# Patient Record
Sex: Female | Born: 1965 | Race: White | Hispanic: No | Marital: Married | Smoking: Never smoker
Health system: Southern US, Community
[De-identification: ages and names within clinical notes are randomized; demographics above are authoritative.]

---

## 2009-03-25 ENCOUNTER — Encounter: Admission: RE | Admit: 2009-03-25 | Discharge: 2009-03-25 | Payer: Self-pay | Admitting: Obstetrics and Gynecology

## 2010-04-14 ENCOUNTER — Encounter: Admission: RE | Admit: 2010-04-14 | Discharge: 2010-04-14 | Payer: Self-pay | Admitting: Obstetrics and Gynecology

## 2011-04-14 ENCOUNTER — Other Ambulatory Visit: Payer: Self-pay | Admitting: Obstetrics and Gynecology

## 2011-04-14 DIAGNOSIS — Z1231 Encounter for screening mammogram for malignant neoplasm of breast: Secondary | ICD-10-CM

## 2011-05-04 ENCOUNTER — Ambulatory Visit
Admission: RE | Admit: 2011-05-04 | Discharge: 2011-05-04 | Disposition: A | Discharge status: Home / Self Care | Payer: 59 | Source: Ambulatory Visit | Attending: Obstetrics and Gynecology | Admitting: Obstetrics and Gynecology

## 2011-05-04 DIAGNOSIS — Z1231 Encounter for screening mammogram for malignant neoplasm of breast: Secondary | ICD-10-CM

## 2012-03-29 ENCOUNTER — Other Ambulatory Visit: Payer: Self-pay | Admitting: Obstetrics and Gynecology

## 2012-03-29 DIAGNOSIS — Z1231 Encounter for screening mammogram for malignant neoplasm of breast: Secondary | ICD-10-CM

## 2012-05-08 ENCOUNTER — Ambulatory Visit
Admission: RE | Admit: 2012-05-08 | Discharge: 2012-05-08 | Disposition: A | Discharge status: Home / Self Care | Payer: 59 | Source: Ambulatory Visit | Attending: Obstetrics and Gynecology | Admitting: Obstetrics and Gynecology

## 2012-05-08 DIAGNOSIS — Z1231 Encounter for screening mammogram for malignant neoplasm of breast: Secondary | ICD-10-CM

## 2013-06-14 ENCOUNTER — Other Ambulatory Visit: Payer: Self-pay

## 2013-06-14 DIAGNOSIS — Z1231 Encounter for screening mammogram for malignant neoplasm of breast: Secondary | ICD-10-CM

## 2013-07-05 HISTORY — PX: OTHER SURGICAL HISTORY: SHX169

## 2013-07-20 ENCOUNTER — Ambulatory Visit
Admission: RE | Admit: 2013-07-20 | Discharge: 2013-07-20 | Disposition: A | Discharge status: Home / Self Care | Payer: 59 | Source: Ambulatory Visit

## 2013-07-20 DIAGNOSIS — Z1231 Encounter for screening mammogram for malignant neoplasm of breast: Secondary | ICD-10-CM

## 2016-08-04 DIAGNOSIS — Z0000 Encounter for general adult medical examination without abnormal findings: Secondary | ICD-10-CM | POA: Diagnosis not present

## 2016-08-17 DIAGNOSIS — Z0000 Encounter for general adult medical examination without abnormal findings: Secondary | ICD-10-CM | POA: Diagnosis not present

## 2016-08-17 DIAGNOSIS — Z23 Encounter for immunization: Secondary | ICD-10-CM | POA: Diagnosis not present

## 2016-10-15 DIAGNOSIS — Z23 Encounter for immunization: Secondary | ICD-10-CM | POA: Diagnosis not present

## 2016-11-16 ENCOUNTER — Ambulatory Visit
Admission: RE | Admit: 2016-11-16 | Discharge: 2016-11-16 | Disposition: A | Discharge status: Home / Self Care | Payer: Commercial Managed Care - HMO | Source: Ambulatory Visit | Attending: Family Medicine | Admitting: Family Medicine

## 2016-11-16 ENCOUNTER — Other Ambulatory Visit: Payer: Self-pay | Admitting: Family Medicine

## 2016-11-16 DIAGNOSIS — R0781 Pleurodynia: Secondary | ICD-10-CM

## 2016-11-16 DIAGNOSIS — M940 Chondrocostal junction syndrome [Tietze]: Secondary | ICD-10-CM | POA: Diagnosis not present

## 2016-11-16 DIAGNOSIS — R0789 Other chest pain: Secondary | ICD-10-CM | POA: Diagnosis not present

## 2017-02-28 DIAGNOSIS — Z23 Encounter for immunization: Secondary | ICD-10-CM | POA: Diagnosis not present

## 2017-08-17 DIAGNOSIS — Z0000 Encounter for general adult medical examination without abnormal findings: Secondary | ICD-10-CM | POA: Diagnosis not present

## 2017-08-18 ENCOUNTER — Other Ambulatory Visit: Payer: Self-pay | Admitting: Family Medicine

## 2017-08-18 DIAGNOSIS — Z1231 Encounter for screening mammogram for malignant neoplasm of breast: Secondary | ICD-10-CM

## 2017-08-19 DIAGNOSIS — Z118 Encounter for screening for other infectious and parasitic diseases: Secondary | ICD-10-CM | POA: Diagnosis not present

## 2017-08-19 DIAGNOSIS — Z01419 Encounter for gynecological examination (general) (routine) without abnormal findings: Secondary | ICD-10-CM | POA: Diagnosis not present

## 2017-08-19 DIAGNOSIS — Z0000 Encounter for general adult medical examination without abnormal findings: Secondary | ICD-10-CM | POA: Diagnosis not present

## 2017-09-07 ENCOUNTER — Ambulatory Visit
Admission: RE | Admit: 2017-09-07 | Discharge: 2017-09-07 | Disposition: A | Discharge status: Home / Self Care | Payer: 59 | Source: Ambulatory Visit | Attending: Family Medicine | Admitting: Family Medicine

## 2017-09-07 DIAGNOSIS — Z1231 Encounter for screening mammogram for malignant neoplasm of breast: Secondary | ICD-10-CM

## 2018-05-18 DIAGNOSIS — Z1283 Encounter for screening for malignant neoplasm of skin: Secondary | ICD-10-CM | POA: Diagnosis not present

## 2018-05-18 DIAGNOSIS — B078 Other viral warts: Secondary | ICD-10-CM | POA: Diagnosis not present

## 2018-05-18 DIAGNOSIS — D225 Melanocytic nevi of trunk: Secondary | ICD-10-CM | POA: Diagnosis not present

## 2018-05-18 DIAGNOSIS — L305 Pityriasis alba: Secondary | ICD-10-CM | POA: Diagnosis not present

## 2018-05-18 DIAGNOSIS — L820 Inflamed seborrheic keratosis: Secondary | ICD-10-CM | POA: Diagnosis not present

## 2018-08-02 ENCOUNTER — Other Ambulatory Visit: Payer: Self-pay | Admitting: Family Medicine

## 2018-08-02 DIAGNOSIS — Z1231 Encounter for screening mammogram for malignant neoplasm of breast: Secondary | ICD-10-CM

## 2018-08-15 DIAGNOSIS — M9903 Segmental and somatic dysfunction of lumbar region: Secondary | ICD-10-CM | POA: Diagnosis not present

## 2018-08-15 DIAGNOSIS — M9905 Segmental and somatic dysfunction of pelvic region: Secondary | ICD-10-CM | POA: Diagnosis not present

## 2018-08-15 DIAGNOSIS — M47812 Spondylosis without myelopathy or radiculopathy, cervical region: Secondary | ICD-10-CM | POA: Diagnosis not present

## 2018-08-17 DIAGNOSIS — M9903 Segmental and somatic dysfunction of lumbar region: Secondary | ICD-10-CM | POA: Diagnosis not present

## 2018-08-17 DIAGNOSIS — M9905 Segmental and somatic dysfunction of pelvic region: Secondary | ICD-10-CM | POA: Diagnosis not present

## 2018-08-17 DIAGNOSIS — M47812 Spondylosis without myelopathy or radiculopathy, cervical region: Secondary | ICD-10-CM | POA: Diagnosis not present

## 2018-08-22 DIAGNOSIS — M47812 Spondylosis without myelopathy or radiculopathy, cervical region: Secondary | ICD-10-CM | POA: Diagnosis not present

## 2018-08-22 DIAGNOSIS — M9905 Segmental and somatic dysfunction of pelvic region: Secondary | ICD-10-CM | POA: Diagnosis not present

## 2018-08-22 DIAGNOSIS — M9903 Segmental and somatic dysfunction of lumbar region: Secondary | ICD-10-CM | POA: Diagnosis not present

## 2018-08-23 DIAGNOSIS — Z0000 Encounter for general adult medical examination without abnormal findings: Secondary | ICD-10-CM | POA: Diagnosis not present

## 2018-08-23 DIAGNOSIS — E559 Vitamin D deficiency, unspecified: Secondary | ICD-10-CM | POA: Diagnosis not present

## 2018-08-24 DIAGNOSIS — M9903 Segmental and somatic dysfunction of lumbar region: Secondary | ICD-10-CM | POA: Diagnosis not present

## 2018-08-24 DIAGNOSIS — M9905 Segmental and somatic dysfunction of pelvic region: Secondary | ICD-10-CM | POA: Diagnosis not present

## 2018-08-24 DIAGNOSIS — M47812 Spondylosis without myelopathy or radiculopathy, cervical region: Secondary | ICD-10-CM | POA: Diagnosis not present

## 2018-08-25 DIAGNOSIS — Z681 Body mass index (BMI) 19 or less, adult: Secondary | ICD-10-CM | POA: Diagnosis not present

## 2018-08-25 DIAGNOSIS — Z23 Encounter for immunization: Secondary | ICD-10-CM | POA: Diagnosis not present

## 2018-08-25 DIAGNOSIS — Z0000 Encounter for general adult medical examination without abnormal findings: Secondary | ICD-10-CM | POA: Diagnosis not present

## 2018-08-29 DIAGNOSIS — M9903 Segmental and somatic dysfunction of lumbar region: Secondary | ICD-10-CM | POA: Diagnosis not present

## 2018-08-29 DIAGNOSIS — M47812 Spondylosis without myelopathy or radiculopathy, cervical region: Secondary | ICD-10-CM | POA: Diagnosis not present

## 2018-08-29 DIAGNOSIS — M9905 Segmental and somatic dysfunction of pelvic region: Secondary | ICD-10-CM | POA: Diagnosis not present

## 2018-09-01 ENCOUNTER — Encounter: Payer: Self-pay | Admitting: Internal Medicine

## 2018-09-05 DIAGNOSIS — M9903 Segmental and somatic dysfunction of lumbar region: Secondary | ICD-10-CM | POA: Diagnosis not present

## 2018-09-05 DIAGNOSIS — M9905 Segmental and somatic dysfunction of pelvic region: Secondary | ICD-10-CM | POA: Diagnosis not present

## 2018-09-05 DIAGNOSIS — M47812 Spondylosis without myelopathy or radiculopathy, cervical region: Secondary | ICD-10-CM | POA: Diagnosis not present

## 2018-09-12 ENCOUNTER — Ambulatory Visit
Admission: RE | Admit: 2018-09-12 | Discharge: 2018-09-12 | Disposition: A | Discharge status: Home / Self Care | Payer: 59 | Source: Ambulatory Visit | Attending: Family Medicine | Admitting: Family Medicine

## 2018-09-12 DIAGNOSIS — Z1231 Encounter for screening mammogram for malignant neoplasm of breast: Secondary | ICD-10-CM | POA: Diagnosis not present

## 2018-09-18 ENCOUNTER — Telehealth: Payer: Self-pay | Admitting: *Deleted

## 2018-09-18 NOTE — Telephone Encounter (Signed)
Covid-19 travel screening questions  Have you traveled in the last 14 days? No If yes where?   Do you now or have you had a fever in the last 14 days? No  Do you have any respiratory symptoms of shortness of breath or cough now or in the last 14 days? No  Do you have a medical history of Congestive Heart Failure? No  Do you have a medical history of lung disease? No  Do you have any family members or close contacts with diagnosed or suspected Covid-19? No        

## 2018-09-18 NOTE — Telephone Encounter (Signed)
Covid-19 travel screening questions  Have you traveled in the last 14 days? If yes where?  Do you now or have you had a fever in the last 14 days?  Do you have any respiratory symptoms of shortness of breath or cough now or in the last 14 days?  Do you have a medical history of Congestive Heart Failure?  Do you have a medical history of lung disease?  Do you have any family members or close contacts with diagnosed or suspected Covid-19?       

## 2018-09-19 ENCOUNTER — Other Ambulatory Visit: Payer: Self-pay

## 2018-09-19 ENCOUNTER — Ambulatory Visit (AMBULATORY_SURGERY_CENTER): Payer: Self-pay

## 2018-09-19 ENCOUNTER — Encounter: Payer: Self-pay | Admitting: Internal Medicine

## 2018-09-19 VITALS — Ht 65.0 in | Wt 113.5 lb

## 2018-09-19 DIAGNOSIS — M9905 Segmental and somatic dysfunction of pelvic region: Secondary | ICD-10-CM | POA: Diagnosis not present

## 2018-09-19 DIAGNOSIS — M47812 Spondylosis without myelopathy or radiculopathy, cervical region: Secondary | ICD-10-CM | POA: Diagnosis not present

## 2018-09-19 DIAGNOSIS — Z1211 Encounter for screening for malignant neoplasm of colon: Secondary | ICD-10-CM

## 2018-09-19 DIAGNOSIS — M9903 Segmental and somatic dysfunction of lumbar region: Secondary | ICD-10-CM | POA: Diagnosis not present

## 2018-09-19 MED ORDER — NA SULFATE-K SULFATE-MG SULF 17.5-3.13-1.6 GM/177ML PO SOLN: 1.0000 | Freq: Once | ORAL | 0 refills | Status: AC

## 2018-09-19 NOTE — Progress Notes (Signed)
Per pt, no allergies to soy or egg products.Pt not taking any weight loss meds or using  O2 at home.  Pt refused emmi video.  Temp-98.5. Pt denies any recent travel or any symptoms of any illness at this time.

## 2018-09-28 ENCOUNTER — Telehealth: Payer: Self-pay | Admitting: *Deleted

## 2018-09-28 NOTE — Telephone Encounter (Signed)
LM for patient to inform her that her procedure for 3/31 is cancelled.  Advised her we will call back to reschedule.

## 2018-10-03 ENCOUNTER — Encounter: Payer: 59 | Admitting: Internal Medicine

## 2018-10-05 DIAGNOSIS — M9905 Segmental and somatic dysfunction of pelvic region: Secondary | ICD-10-CM | POA: Diagnosis not present

## 2018-10-05 DIAGNOSIS — M9903 Segmental and somatic dysfunction of lumbar region: Secondary | ICD-10-CM | POA: Diagnosis not present

## 2018-10-05 DIAGNOSIS — M47812 Spondylosis without myelopathy or radiculopathy, cervical region: Secondary | ICD-10-CM | POA: Diagnosis not present

## 2018-10-19 DIAGNOSIS — M47812 Spondylosis without myelopathy or radiculopathy, cervical region: Secondary | ICD-10-CM | POA: Diagnosis not present

## 2018-10-19 DIAGNOSIS — M9903 Segmental and somatic dysfunction of lumbar region: Secondary | ICD-10-CM | POA: Diagnosis not present

## 2018-10-19 DIAGNOSIS — M9905 Segmental and somatic dysfunction of pelvic region: Secondary | ICD-10-CM | POA: Diagnosis not present

## 2018-11-06 ENCOUNTER — Telehealth: Payer: Self-pay | Admitting: *Deleted

## 2018-11-06 NOTE — Telephone Encounter (Signed)
Patient called, no answer. Left message for her to call us back to schedule colonoscopy at this time.

## 2018-11-07 DIAGNOSIS — M9905 Segmental and somatic dysfunction of pelvic region: Secondary | ICD-10-CM | POA: Diagnosis not present

## 2018-11-07 DIAGNOSIS — M47812 Spondylosis without myelopathy or radiculopathy, cervical region: Secondary | ICD-10-CM | POA: Diagnosis not present

## 2018-11-07 DIAGNOSIS — M9903 Segmental and somatic dysfunction of lumbar region: Secondary | ICD-10-CM | POA: Diagnosis not present

## 2018-11-07 NOTE — Telephone Encounter (Signed)
Pt called and  resched colon 11/22/2018 @ 1:30pm

## 2018-11-07 NOTE — Telephone Encounter (Signed)
Mailed new prep instructions for pt's RS 5-20 colon   Lelan Pons PV

## 2018-11-20 ENCOUNTER — Telehealth: Payer: Self-pay

## 2018-11-20 ENCOUNTER — Telehealth: Payer: Self-pay | Admitting: *Deleted

## 2018-11-20 NOTE — Telephone Encounter (Signed)
Unable to reach pt

## 2018-11-20 NOTE — Telephone Encounter (Signed)
Covid-19 travel screening questions  Have you traveled in the last 14 days?no If yes where?  Do you now or have you had a fever in the last 14 days?no  Do you have any respiratory symptoms of shortness of breath or cough now or in the last 14 days?no  Do you have a medical history of Congestive Heart Failure?  Do you have a medical history of lung disease?  Do you have any family members or close contacts with diagnosed or suspected Covid-19?no  Pt aware of care partner policy and will bring a mask with her. SM

## 2018-11-21 DIAGNOSIS — M9903 Segmental and somatic dysfunction of lumbar region: Secondary | ICD-10-CM | POA: Diagnosis not present

## 2018-11-21 DIAGNOSIS — M9905 Segmental and somatic dysfunction of pelvic region: Secondary | ICD-10-CM | POA: Diagnosis not present

## 2018-11-21 DIAGNOSIS — M47812 Spondylosis without myelopathy or radiculopathy, cervical region: Secondary | ICD-10-CM | POA: Diagnosis not present

## 2018-11-22 ENCOUNTER — Other Ambulatory Visit: Payer: Self-pay

## 2018-11-22 ENCOUNTER — Encounter: Payer: Self-pay | Admitting: Internal Medicine

## 2018-11-22 ENCOUNTER — Ambulatory Visit (AMBULATORY_SURGERY_CENTER): Payer: 59 | Admitting: Internal Medicine

## 2018-11-22 VITALS — BP 96/69 | HR 67 | Temp 98.3°F | Resp 11 | Ht 65.0 in | Wt 113.0 lb

## 2018-11-22 DIAGNOSIS — D120 Benign neoplasm of cecum: Secondary | ICD-10-CM

## 2018-11-22 DIAGNOSIS — D122 Benign neoplasm of ascending colon: Secondary | ICD-10-CM

## 2018-11-22 DIAGNOSIS — D123 Benign neoplasm of transverse colon: Secondary | ICD-10-CM | POA: Diagnosis not present

## 2018-11-22 DIAGNOSIS — Z1211 Encounter for screening for malignant neoplasm of colon: Secondary | ICD-10-CM

## 2018-11-22 MED ORDER — SODIUM CHLORIDE 0.9 % IV SOLN: 500.0000 mL | Freq: Once | INTRAVENOUS | Status: DC

## 2018-11-22 NOTE — Progress Notes (Signed)
Pt's states no medical or surgical changes since previsit or office visit. Natasha Nguyen-Temps,Judy Branson-vital signs. 

## 2018-11-22 NOTE — Op Note (Signed)
[place] Patient Name: Natasha Nguyen Procedure Date: 11/22/2018 2:03 PM MRN: [id number] Endoscopist: Jerene Bears , MD Age: 53 Referring MD:  Date of Birth: 05-May-1966 Gender: Female Account #: [id number] Procedure:                Colonoscopy Indications:              Screening for colorectal malignant neoplasm, This                            is the patient's first colonoscopy Medicines:                Monitored Anesthesia Care Procedure:                Pre-Anesthesia Assessment:                           - Prior to the procedure, a History and Physical                            was performed, and patient medications and                            allergies were reviewed. The patient's tolerance of                            previous anesthesia was also reviewed. The risks                            and benefits of the procedure and the sedation                            options and risks were discussed with the patient.                            All questions were answered, and informed consent                            was obtained. Prior Anticoagulants: The patient has                            taken no previous anticoagulant or antiplatelet                            agents. ASA Grade Assessment: I - A normal, healthy                            patient. After reviewing the risks and benefits,                            the patient was deemed in satisfactory condition to                            undergo the procedure.  After obtaining informed consent, the colonoscope                            was passed under direct vision. Throughout the                            procedure, the patient's blood pressure, pulse, and                            oxygen saturations were monitored continuously. The                            Colonoscope was introduced through the anus and                            advanced to the cecum, identified by  appendiceal                            orifice and ileocecal valve. The colonoscopy was                            performed without difficulty. The patient tolerated                            the procedure well. The quality of the bowel                            preparation was good. The ileocecal valve,                            appendiceal orifice, and rectum were photographed. Scope In: 2:10:43 PM Scope Out: 2:31:04 PM Scope Withdrawal Time: 0 hours 15 minutes 32 seconds  Total Procedure Duration: 0 hours 20 minutes 21 seconds  Findings:                 The digital rectal exam was normal.                           A 1 mm polyp was found in the cecum. The polyp was                            sessile. The polyp was removed with a cold snare.                            Resection and retrieval were complete.                           A 5 mm polyp was found in the ascending colon. The                            polyp was sessile. The polyp was removed with a  cold snare. Resection and retrieval were complete.                           Two sessile polyps were found in the transverse                            colon. The polyps were 2 to 4 mm in size. These                            polyps were removed with a cold snare. Resection                            and retrieval were complete.                           The exam was otherwise without abnormality on                            direct and retroflexion views. Complications:            No immediate complications. Estimated Blood Loss:     Estimated blood loss was minimal. Impression:               - One 1 mm polyp in the cecum, removed with a cold                            snare. Resected and retrieved.                           - One 5 mm polyp in the ascending colon, removed                            with a cold snare. Resected and retrieved.                           - Two 2 to 4 mm polyps in the  transverse colon,                            removed with a cold snare. Resected and retrieved.                           - The examination was otherwise normal on direct                            and retroflexion views. Recommendation:           - Patient has a contact number available for                            emergencies. The signs and symptoms of potential                            delayed complications were discussed with the  patient. Return to normal activities tomorrow.                            Written discharge instructions were provided to the                            patient.                           - Resume previous diet.                           - Continue present medications.                           - Await pathology results.                           - Repeat colonoscopy is recommended for                            surveillance. The colonoscopy date will be                            determined after pathology results from today's                            exam become available for review. Jerene Bears, MD 11/22/2018 2:33:57 PM This report has been signed electronically.

## 2018-11-22 NOTE — Progress Notes (Signed)
Called to room to assist during endoscopic procedure.  Patient ID and intended procedure confirmed with present staff. Received instructions for my participation in the procedure from the performing physician.  

## 2018-11-22 NOTE — Patient Instructions (Signed)
YOU HAD AN ENDOSCOPIC PROCEDURE TODAY AT [facility]:   Refer to the procedure report that was given to you for any specific questions about what was found during the examination.  If the procedure report does not answer your questions, please call your gastroenterologist to clarify.  If you requested that your care partner not be given the details of your procedure findings, then the procedure report has been included in a sealed envelope for you to review at your convenience later.  YOU SHOULD EXPECT: Some feelings of bloating in the abdomen. Passage of more gas than usual.  Walking can help get rid of the air that was put into your GI tract during the procedure and reduce the bloating. If you had a lower endoscopy (such as a colonoscopy or flexible sigmoidoscopy) you may notice spotting of blood in your stool or on the toilet paper. If you underwent a bowel prep for your procedure, you may not have a normal bowel movement for a few days.  Please Note:  You might notice some irritation and congestion in your nose or some drainage.  This is from the oxygen used during your procedure.  There is no need for concern and it should clear up in a day or so.  SYMPTOMS TO REPORT IMMEDIATELY:   Following lower endoscopy (colonoscopy or flexible sigmoidoscopy):  Excessive amounts of blood in the stool  Significant tenderness or worsening of abdominal pains  Swelling of the abdomen that is new, acute  Fever of 100F or higher  For urgent or emergent issues, a gastroenterologist can be reached at any hour by calling [phone].   DIET:  We do recommend a small meal at first, but then you may proceed to your regular diet.  Drink plenty of fluids but you should avoid alcoholic beverages for 24 hours.  ACTIVITY:  You should plan to take it easy for the rest of today and you should NOT DRIVE or use heavy machinery until tomorrow (because of the sedation medicines used during the test).     FOLLOW UP: Our staff will call the number listed on your records 48-72 hours following your procedure to check on you and address any questions or concerns that you may have regarding the information given to you following your procedure. If we do not reach you, we will leave a message.  We will attempt to reach you two times.  During this call, we will ask if you have developed any symptoms of COVID 19. If you develop any symptoms (for example fever, flu-like symptoms, shortness of breath, cough etc.) before then, please call [phone].  If any biopsies were taken you will be contacted by phone or by letter within the next 1-3 weeks.  Please call us at [phone] if you have not heard about the biopsies in 3 weeks.   Await for biopsy results Polyps (handout given)  SIGNATURES/CONFIDENTIALITY: You and/or your care partner have signed paperwork which will be entered into your electronic medical record.  These signatures attest to the fact that that the information above on your After Visit Summary has been reviewed and is understood.  Full responsibility of the confidentiality of this discharge information lies with you and/or your care-partner.

## 2018-11-22 NOTE — Progress Notes (Signed)
To PACU, VSS. Report to [email or web address] 

## 2018-11-24 ENCOUNTER — Telehealth: Payer: Self-pay | Admitting: *Deleted

## 2018-11-24 ENCOUNTER — Telehealth: Payer: Self-pay

## 2018-11-24 NOTE — Telephone Encounter (Signed)
Left message on follow up call. 

## 2018-11-24 NOTE — Telephone Encounter (Signed)
  Follow up Call-  Call back number 11/22/2018  Post procedure Call Back phone  # [phone]  Permission to leave phone message Yes  Some recent data might be hidden     Patient questions:  Do you have a fever, pain , or abdominal swelling? No. Pain Score  0 *  Have you tolerated food without any problems? Yes.    Have you been able to return to your normal activities? Yes.    Do you have any questions about your discharge instructions: Diet   No. Medications  No. Follow up visit  No.  Do you have questions or concerns about your Care? No.  Actions: * If pain score is 4 or above: No action needed, pain <4.  1. Have you developed a fever since your procedure? no  2.   Have you had an respiratory symptoms (SOB or cough) since your procedure? no  3.   Have you tested positive for COVID 19 since your procedure no  4.   Have you had any family members/close contacts diagnosed with the COVID 19 since your procedure?  no   If any of these questions are a yes, please inquire if patient has been seen by family doctor and route this note to Joylene John, Therapist, sports.

## 2018-11-28 ENCOUNTER — Encounter: Payer: Self-pay | Admitting: Internal Medicine

## 2019-08-29 ENCOUNTER — Emergency Department (HOSPITAL_COMMUNITY): Payer: 59

## 2019-08-29 ENCOUNTER — Encounter (HOSPITAL_COMMUNITY): Payer: Self-pay | Admitting: Emergency Medicine

## 2019-08-29 ENCOUNTER — Emergency Department (HOSPITAL_COMMUNITY)
Admission: EM | Admit: 2019-08-29 | Discharge: 2019-08-29 | Disposition: A | Discharge status: Home / Self Care | Payer: 59 | Attending: Emergency Medicine | Admitting: Emergency Medicine

## 2019-08-29 ENCOUNTER — Other Ambulatory Visit: Payer: Self-pay

## 2019-08-29 DIAGNOSIS — R102 Pelvic and perineal pain: Secondary | ICD-10-CM | POA: Diagnosis not present

## 2019-08-29 DIAGNOSIS — K529 Noninfective gastroenteritis and colitis, unspecified: Secondary | ICD-10-CM | POA: Insufficient documentation

## 2019-08-29 LAB — COMPREHENSIVE METABOLIC PANEL
ALT: 11 U/L (ref 0–44)
AST: 13 U/L — ABNORMAL LOW (ref 15–41)
Albumin: 3.9 g/dL (ref 3.5–5.0)
Alkaline Phosphatase: 51 U/L (ref 38–126)
Anion gap: 9 (ref 5–15)
BUN: 23 mg/dL — ABNORMAL HIGH (ref 6–20)
CO2: 26 mmol/L (ref 22–32)
Calcium: 9.8 mg/dL (ref 8.9–10.3)
Chloride: 103 mmol/L (ref 98–111)
Creatinine, Ser: 0.76 mg/dL (ref 0.44–1.00)
GFR calc Af Amer: >60 mL/min (ref >60)
GFR calc non Af Amer: >60 mL/min (ref >60)
Glucose, Bld: 107 mg/dL — ABNORMAL HIGH (ref 70–99)
Potassium: 3.5 mmol/L (ref 3.5–5.1)
Sodium: 138 mmol/L (ref 135–145)
Total Bilirubin: 0.5 mg/dL (ref 0.3–1.2)
Total Protein: 6.4 g/dL — ABNORMAL LOW (ref 6.5–8.1)

## 2019-08-29 LAB — URINALYSIS, ROUTINE W REFLEX MICROSCOPIC
Bilirubin Urine: NEGATIVE
Glucose, UA: NEGATIVE mg/dL
Hgb urine dipstick: NEGATIVE
Ketones, ur: NEGATIVE mg/dL
Leukocytes,Ua: NEGATIVE
Nitrite: NEGATIVE
Protein, ur: NEGATIVE mg/dL
Specific Gravity, Urine: 1.002 — ABNORMAL LOW (ref 1.005–1.030)
pH: 7 (ref 5.0–8.0)

## 2019-08-29 LAB — CBC
HCT: 38.9 % (ref 36.0–46.0)
Hemoglobin: 13.1 g/dL (ref 12.0–15.0)
MCH: 31.6 pg (ref 26.0–34.0)
MCHC: 33.7 g/dL (ref 30.0–36.0)
MCV: 93.7 fL (ref 80.0–100.0)
Platelets: 339 10*3/uL (ref 150–400)
RBC: 4.15 MIL/uL (ref 3.87–5.11)
RDW: 12.2 % (ref 11.5–15.5)
WBC: 8.6 10*3/uL (ref 4.0–10.5)
nRBC: 0 % (ref 0.0–0.2)

## 2019-08-29 LAB — LIPASE, BLOOD: Lipase: 34 U/L (ref 11–51)

## 2019-08-29 LAB — I-STAT BETA HCG BLOOD, ED (MC, WL, AP ONLY): I-stat hCG, quantitative: <5 m[IU]/mL (ref <5)

## 2019-08-29 MED ORDER — SODIUM CHLORIDE 0.9% FLUSH
3.0000 mL | Freq: Once | INTRAVENOUS | Status: AC
  Administered 2019-08-29: 05:00:00 3 mL via INTRAVENOUS

## 2019-08-29 MED ORDER — IOHEXOL 300 MG/ML  SOLN
100.0000 mL | Freq: Once | INTRAMUSCULAR | Status: AC | PRN
  Administered 2019-08-29: 100 mL via INTRAVENOUS

## 2019-08-29 MED ORDER — KETOROLAC TROMETHAMINE 30 MG/ML IJ SOLN
15.0000 mg | Freq: Once | INTRAMUSCULAR | Status: AC
  Administered 2019-08-29: 15 mg via INTRAVENOUS
  Filled 2019-08-29: qty 1

## 2019-08-29 MED ORDER — DICYCLOMINE HCL 20 MG PO TABS: 20.0000 mg | ORAL_TABLET | Freq: Two times a day (BID) | ORAL | 0 refills | Status: DC | PRN

## 2019-08-29 MED ORDER — CIPROFLOXACIN HCL 500 MG PO TABS: 500.0000 mg | ORAL_TABLET | Freq: Two times a day (BID) | ORAL | 0 refills | Status: DC

## 2019-08-29 NOTE — ED Notes (Signed)
Pt transported to [facility]

## 2019-08-29 NOTE — ED Provider Notes (Signed)
[facility] EMERGENCY DEPARTMENT Provider Note   CSN: [id number] Arrival date & time: 08/29/19  0157     History Chief Complaint  Patient presents with  . Abdominal Pain    Natasha Nguyen is a 54 y.o. female.  54 year old female presents to the emergency department for evaluation of abdominal pain.  She states that her pain began more abruptly at 2300 tonight and has remained constant.  She describes a "piercing" pain which is more to the right and below her bellybutton.  She tried taking Pepcid and a Pepto-Bismol for symptoms without relief.  Felt that her pain was increasingly aggravated when she tried to extend her back and abdomen.  Had a bowel movement earlier tonight that was consistent with diarrhea.  No associated fevers, nausea, vomiting, dysuria, hematuria, melena or hematochezia.  Abdominal surgical history significant for cesarean section x2.  Has not had any menstrual cycle since uterine ablation.  Denies recent sick contacts.  The history is provided by the patient. No language interpreter was used.  Abdominal Pain      History reviewed. No pertinent past medical history.  There are no problems to display for this patient.   Past Surgical History:  Procedure Laterality Date  . CESAREAN SECTION     2 times  . uterine ablation  2015     OB History   No obstetric history on file.     Family History  Problem Relation Age of Onset  . Breast cancer Mother     Social History   Tobacco Use  . Smoking status: Never Smoker  . Smokeless tobacco: Never Used  Substance Use Topics  . Alcohol use: Yes    Comment: social  . Drug use: Never    Home Medications Prior to Admission medications   Medication Sig Start Date End Date Taking? Authorizing Provider  ciprofloxacin (CIPRO) 500 MG tablet Take 1 tablet (500 mg total) by mouth every 12 (twelve) hours. 08/29/19   Antonietta Breach, PA-C  dicyclomine (BENTYL) 20 MG tablet Take 1 tablet (20 mg  total) by mouth every 12 (twelve) hours as needed (for abdominal pain/cramping). 08/29/19   Antonietta Breach, PA-C    Allergies    Patient has no known allergies.  Review of Systems   Review of Systems  Gastrointestinal: Positive for abdominal pain.  Ten systems reviewed and are negative for acute change, except as noted in the HPI.    Physical Exam Updated Vital Signs BP 115/71   Pulse 77   Temp (!) 97.4 F (36.3 C) (Oral)   Resp 15   Ht 5\' 5"  (1.651 m)   Wt 48.5 kg   SpO2 100%   BMI 17.81 kg/m   Physical Exam Vitals and nursing note reviewed.  Constitutional:      General: She is not in acute distress.    Appearance: She is well-developed. She is not diaphoretic.     Comments: Nontoxic appearing and in NAD  HENT:     Head: Normocephalic and atraumatic.  Eyes:     General: No scleral icterus.    Conjunctiva/sclera: Conjunctivae normal.  Cardiovascular:     Rate and Rhythm: Normal rate and regular rhythm.     Pulses: Normal pulses.  Pulmonary:     Effort: Pulmonary effort is normal. No respiratory distress.     Comments: Respirations even and unlabored Abdominal:     General: There is no distension.     Palpations: Abdomen is soft.  Tenderness: Negative signs include Murphy's sign.    Musculoskeletal:        General: Normal range of motion.     Cervical back: Normal range of motion.  Skin:    General: Skin is warm and dry.     Coloration: Skin is not pale.     Findings: No erythema or rash.  Neurological:     Mental Status: She is alert and oriented to person, place, and time.     Coordination: Coordination normal.  Psychiatric:        Behavior: Behavior normal.     ED Results / Procedures / Treatments   Labs (all labs ordered are listed, but only abnormal results are displayed) Labs Reviewed  COMPREHENSIVE METABOLIC PANEL - Abnormal; Notable for the following components:      Result Value   Glucose, Bld 107 (*)    BUN 23 (*)    Total Protein 6.4  (*)    AST 13 (*)    All other components within normal limits  URINALYSIS, ROUTINE W REFLEX MICROSCOPIC - Abnormal; Notable for the following components:   Color, Urine COLORLESS (*)    Specific Gravity, Urine 1.002 (*)    All other components within normal limits  LIPASE, BLOOD  CBC  I-STAT BETA HCG BLOOD, ED ([facility], [facility], [facility] ONLY)    EKG EKG Interpretation  Date/Time:  Wednesday August 29 2019 02:15:53 EST Ventricular Rate:  69 PR Interval:  136 QRS Duration: 80 QT Interval:  378 QTC Calculation: 405 R Axis:   90 Text Interpretation: Normal sinus rhythm Rightward axis Borderline ECG No old tracing to compare Confirmed by Delora Fuel ([id number]) on 08/29/2019 3:18:17 AM   Radiology CT ABDOMEN PELVIS W CONTRAST  Result Date: 08/29/2019 CLINICAL DATA:  Periumbilical abdominal pain began at 2300 hours EXAM: CT ABDOMEN AND PELVIS WITH CONTRAST TECHNIQUE: Multidetector CT imaging of the abdomen and pelvis was performed using the standard protocol following bolus administration of intravenous contrast. CONTRAST:  160mL OMNIPAQUE IOHEXOL 300 MG/ML  SOLN COMPARISON:  Pelvic ultrasound 08/29/2019 FINDINGS: Lower chest: Lung bases are clear. Much of the heart is collimated from view. Hepatobiliary: Small amount of focal fatty infiltration along the falciform ligament. 7 mm hypoattenuating focus in the posterior right lobe liver is too small to fully characterize on CT imaging but statistically likely benign. Gallbladder is unremarkable without calcified gallstones, gallbladder wall thickening, or biliary dilatation. Pancreas: Unremarkable. No pancreatic ductal dilatation or surrounding inflammatory changes. Spleen: Benign-appearing probable cyst versus lymphangioma in the posterior spleen measuring 10 mm. No worrisome splenic lesions. Normal splenic size. Adrenals/Urinary Tract: Normal adrenal glands. Symmetric renal enhancement and excretion. Subcentimeter hypoattenuating foci in the interpolar left  and lower pole right kidney are too small to characterize. No worrisome renal lesions. No urolithiasis or hydronephrosis. Ureters are unremarkable. Urinary bladder is free of acute abnormality. Stomach/Bowel: Distal esophagus, stomach and duodenal sweep are unremarkable. No small bowel wall thickening or dilatation. No evidence of obstruction. A normal appendix is visualized. Mild edematous mural thickening involving much of the distal colon from the transverse to sigmoid. Vascular/Lymphatic: The aorta is normal caliber. No suspicious or enlarged lymph nodes in the included lymphatic chains. Reproductive: Anteverted uterus. Hypoattenuation at the lower uterine segment compatible with prior Caesarean sections. 1.4 cm left para ovarian cyst is again visualized. Better seen on this examination is a 2.4 cm cyst in the right adnexa as well. No concerning features. Other: No free fluid. No free air. No bowel containing hernias.  Musculoskeletal: No acute osseous abnormality or suspicious osseous lesion. IMPRESSION: 1. Mild edematous mural thickening involving much of the distal colon from the transverse to the sigmoid colon may represent infectious or inflammatory colitis. Electronically Signed   By: Lovena Le M.D.   On: 08/29/2019 06:04   US PELVIC DOPPLER (TORSION R/O OR MASS ARTERIAL FLOW)  Result Date: 08/29/2019 CLINICAL DATA:  Pelvic pain, history of prior Caesarean section x2 EXAM: TRANSABDOMINAL AND TRANSVAGINAL ULTRASOUND OF PELVIS DOPPLER ULTRASOUND OF OVARIES TECHNIQUE: Both transabdominal and transvaginal ultrasound examinations of the pelvis were performed. Transabdominal technique was performed for global imaging of the pelvis including uterus, ovaries, adnexal regions, and pelvic cul-de-sac. It was necessary to proceed with endovaginal exam following the transabdominal exam to visualize the uterus, endometrium, and ovaries. Color and duplex Doppler ultrasound was utilized to evaluate blood flow to the  ovaries. COMPARISON:  None. FINDINGS: Uterus Measurements: 7.7 x 3.4 x 4.5 cm = volume: 62.1 mL. Hypoattenuation along the lower uterine segment may reflect scarring from prior Caesarean section. No fibroids or other mass visualized. Endometrium Thickness: 3 mm.  No focal abnormality visualized. Right ovary Measurements: 1.9 x 1.2 x 1.4 cm = volume: 1.7 mL. Normal appearance/no adnexal mass. Left ovary Measurements: 1.9 x 1.5 x 2.8 cm = volume: 4.2 mL. Anechoic cystic structure in the left adnexa appears separate from the left ovary, likely simple para ovarian cyst measuring 1.2 x 1.2 x 1.4 cm in size. Pulsed Doppler evaluation of both ovaries demonstrates normal low-resistance arterial and venous waveforms. Other findings No abnormal free fluid. IMPRESSION: 1.4 cm cyst in the left adnexa, likely para ovarian cyst. Otherwise unremarkable pelvic ultrasound. Electronically Signed   By: Lovena Le M.D.   On: 08/29/2019 05:04   US PELVIC COMPLETE WITH TRANSVAGINAL  Result Date: 08/29/2019 CLINICAL DATA:  Pelvic pain, history of prior Caesarean section x2 EXAM: TRANSABDOMINAL AND TRANSVAGINAL ULTRASOUND OF PELVIS DOPPLER ULTRASOUND OF OVARIES TECHNIQUE: Both transabdominal and transvaginal ultrasound examinations of the pelvis were performed. Transabdominal technique was performed for global imaging of the pelvis including uterus, ovaries, adnexal regions, and pelvic cul-de-sac. It was necessary to proceed with endovaginal exam following the transabdominal exam to visualize the uterus, endometrium, and ovaries. Color and duplex Doppler ultrasound was utilized to evaluate blood flow to the ovaries. COMPARISON:  None. FINDINGS: Uterus Measurements: 7.7 x 3.4 x 4.5 cm = volume: 62.1 mL. Hypoattenuation along the lower uterine segment may reflect scarring from prior Caesarean section. No fibroids or other mass visualized. Endometrium Thickness: 3 mm.  No focal abnormality visualized. Right ovary Measurements: 1.9 x 1.2  x 1.4 cm = volume: 1.7 mL. Normal appearance/no adnexal mass. Left ovary Measurements: 1.9 x 1.5 x 2.8 cm = volume: 4.2 mL. Anechoic cystic structure in the left adnexa appears separate from the left ovary, likely simple para ovarian cyst measuring 1.2 x 1.2 x 1.4 cm in size. Pulsed Doppler evaluation of both ovaries demonstrates normal low-resistance arterial and venous waveforms. Other findings No abnormal free fluid. IMPRESSION: 1.4 cm cyst in the left adnexa, likely para ovarian cyst. Otherwise unremarkable pelvic ultrasound. Electronically Signed   By: Lovena Le M.D.   On: 08/29/2019 05:04    Procedures Procedures (including critical care time)  Medications Ordered in ED Medications  sodium chloride flush (NS) 0.9 % injection 3 mL (3 mLs Intravenous Given 08/29/19 0439)  ketorolac (TORADOL) 30 MG/ML injection 15 mg (15 mg Intravenous Given 08/29/19 0425)  iohexol (OMNIPAQUE) 300 MG/ML solution 100 mL (100  mLs Intravenous Contrast Given 08/29/19 0540)    ED Course  I have reviewed the triage vital signs and the nursing notes.  Pertinent labs & imaging results that were available during my care of the patient were reviewed by me and considered in my medical decision making (see chart for details).  Clinical Course as of Aug 28 621  Wed Aug 29, 2019  0406 Patient presenting for lower abdominal pain.  Has focal tenderness in the right suprapubic abdomen as well as referred pain to the right suprapubic abdomen on palpation of the left lower quadrant.  Abdomen is nondistended, soft.  No peritoneal signs.  More sudden onset of pain is less characteristic of appendicitis.  Patient also does not have any leukocytosis to suggest infectious etiology.  She does have minimal tenderness to the right of her epigastrium, but there is also low suspicion for gallbladder process given preserved LFTs, lack of vomiting.  No hematuria to suggest kidney stone.  Urinalysis without evidence of UTI.  Plan for  pelvic ultrasound to assess for ovarian cyst versus ovarian torsion.  Patient resting comfortably at this time, agreeable with plan.  She has no history of abdominal surgeries.   [KH]  [id number] Ultrasound shows left ovarian cyst, though this is favored to be an incidental finding as it does not correlate as specifically with the patient's area of tenderness.  There is no evidence of ovarian torsion.  Results communicated to patient who verbalizes understanding.  Patient is persistently tender on repeat abdominal exam.  Discussed outpatient symptom control versus further work-up with CT abdomen/pelvis.  Patient comfortable with plan to proceed with additional imaging.  Declines additional pain medication at this time.   [KH]    Clinical Course User Index [KH] Beverely Pace   MDM Rules/Calculators/A&P                      54 year old female presenting for abdominal pain.  Her evaluation today is consistent with mild colitis seen on CT scan.  She is afebrile with stable vital signs.  No leukocytosis.  Symptoms have been present for approximately 10 hours.  It is possible that her symptoms may be food-borne or viral in etiology.  Discussed conservative management including the use of Tylenol, Motrin, Bentyl as needed.  Encouraged use of a daily probiotic as well.  Should symptoms persist beyond 48 hours, will prescribe course of ciprofloxacin to start.  Patient encouraged to follow up with her primary care doctor.  Return precautions discussed and provided. Patient discharged in stable condition with no unaddressed concerns.   Final Clinical Impression(s) / ED Diagnoses Final diagnoses:  Pelvic pain in female  Colitis    Rx / DC Orders ED Discharge Orders         Ordered    ciprofloxacin (CIPRO) 500 MG tablet  Every 12 hours     08/29/19 0619    dicyclomine (BENTYL) 20 MG tablet  Every 12 hours PRN     08/29/19 0619           Sukari, Doerflinger, PA-C 08/29/19 [id number]    Veryl Speak,  MD 08/29/19 [phone]

## 2019-08-29 NOTE — ED Triage Notes (Signed)
Pt reports "pearcing"  abdominal pain mainly around her "belly button" that started around 11pm.  The pain continued to get worse to the point where she could not sleep.  Denies N/V/D

## 2019-08-29 NOTE — Discharge Instructions (Signed)
Your CT scan today showed very mild colitis.  We recommend Tylenol or ibuprofen for management of pain.  You may supplement this with Bentyl as needed for persistent symptoms.  Use an over-the-counter probiotic daily.  Should you experience persistent abdominal pain or worsening symptoms such as fever, vomiting, persistent diarrhea or bloody stool, you may benefit from initiation of antibiotics.  You have been prescribed ciprofloxacin to start if symptoms do not begin to improve within 48 hours.  We do recommend follow-up with your primary care doctor.  Stay adequately hydrated by drinking plenty of clear liquids.  Return for new or concerning symptoms.

## 2020-01-15 ENCOUNTER — Other Ambulatory Visit: Payer: Self-pay | Admitting: Family Medicine

## 2020-01-15 ENCOUNTER — Ambulatory Visit
Admission: RE | Admit: 2020-01-15 | Discharge: 2020-01-15 | Disposition: A | Discharge status: Home / Self Care | Payer: 59 | Source: Ambulatory Visit | Attending: Family Medicine | Admitting: Family Medicine

## 2020-01-15 ENCOUNTER — Other Ambulatory Visit: Payer: Self-pay

## 2020-01-15 DIAGNOSIS — Z1231 Encounter for screening mammogram for malignant neoplasm of breast: Secondary | ICD-10-CM

## 2020-09-02 IMAGING — MG DIGITAL SCREENING BILATERAL MAMMOGRAM WITH TOMO AND CAD
8 series · 9 of 24 positions shown · non-contrast
Comparison: Previous exam(s).

CLINICAL DATA: Screening.

EXAM:
DIGITAL SCREENING BILATERAL MAMMOGRAM WITH TOMO AND CAD

[L MLO synth-2D]
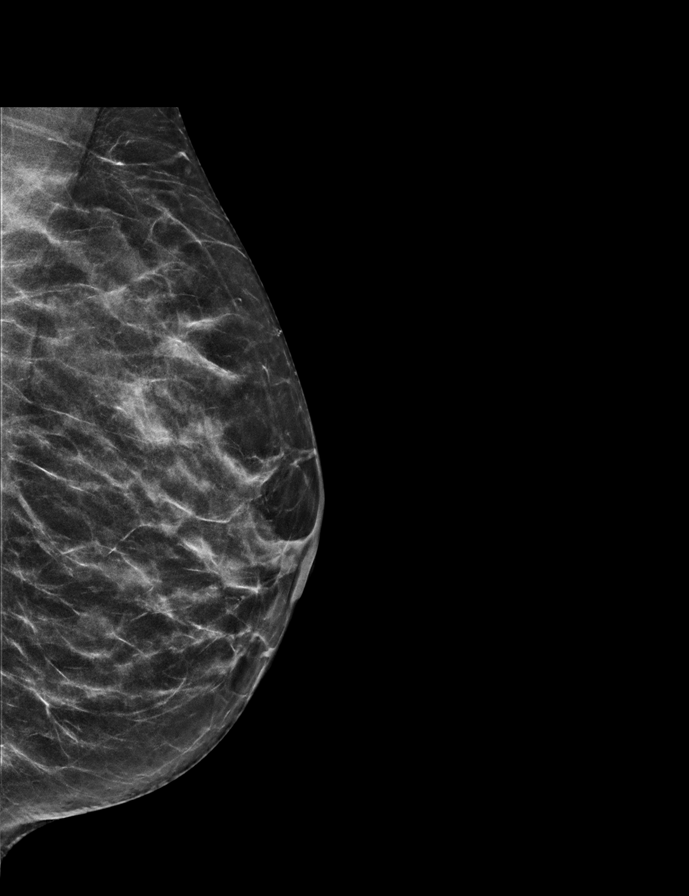

[L CC synth-2D]
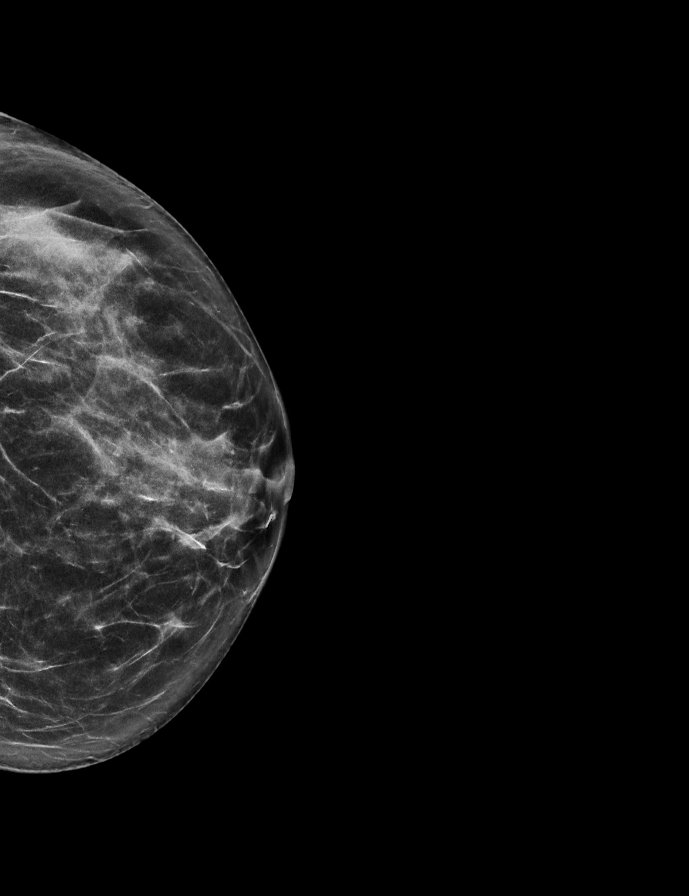

[R MLO synth-2D]
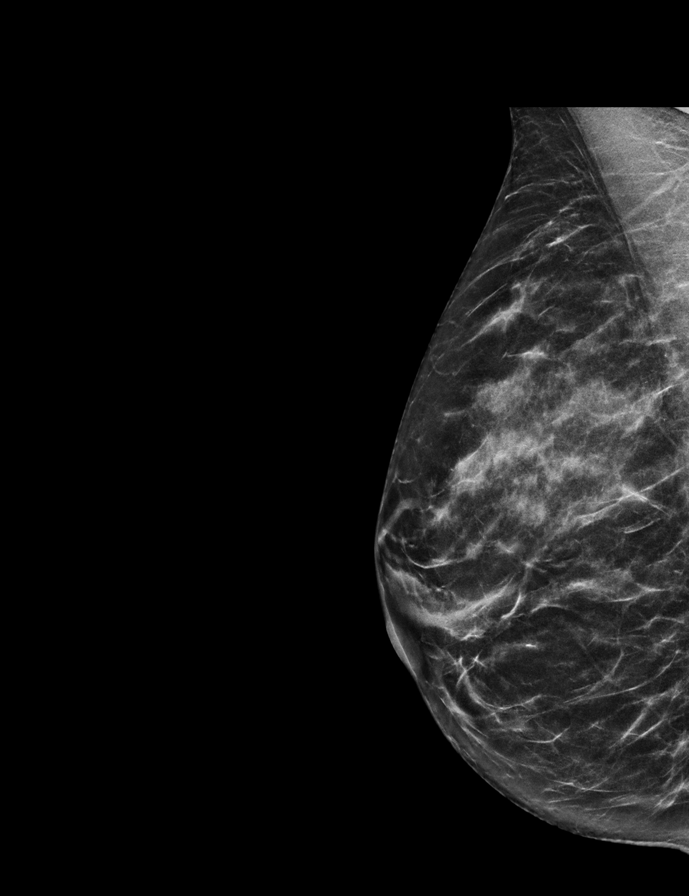

[R CC synth-2D]
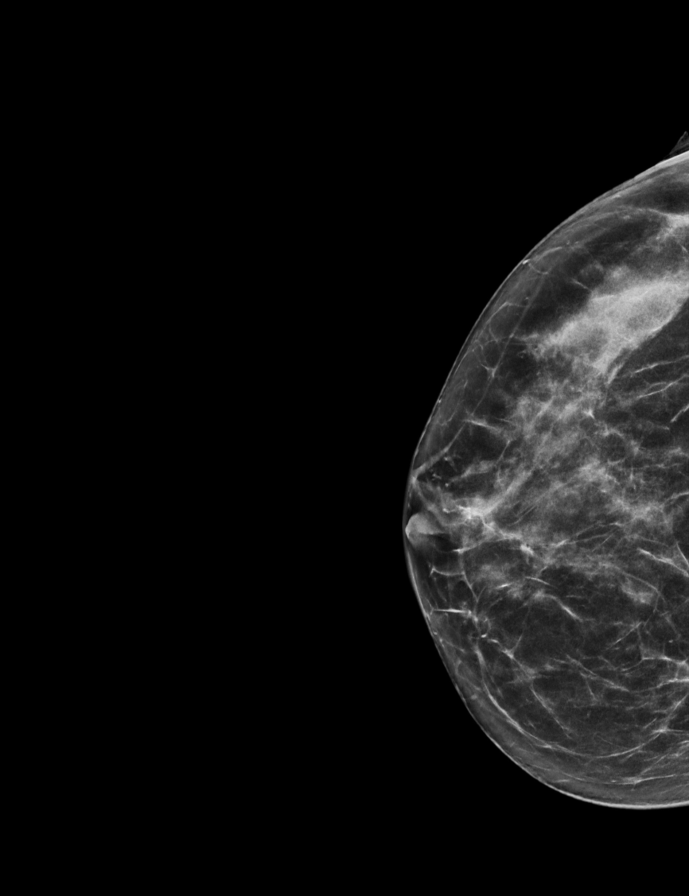

[L MLO tomo · 2 of 56 frames shown]
[frame 19/56]
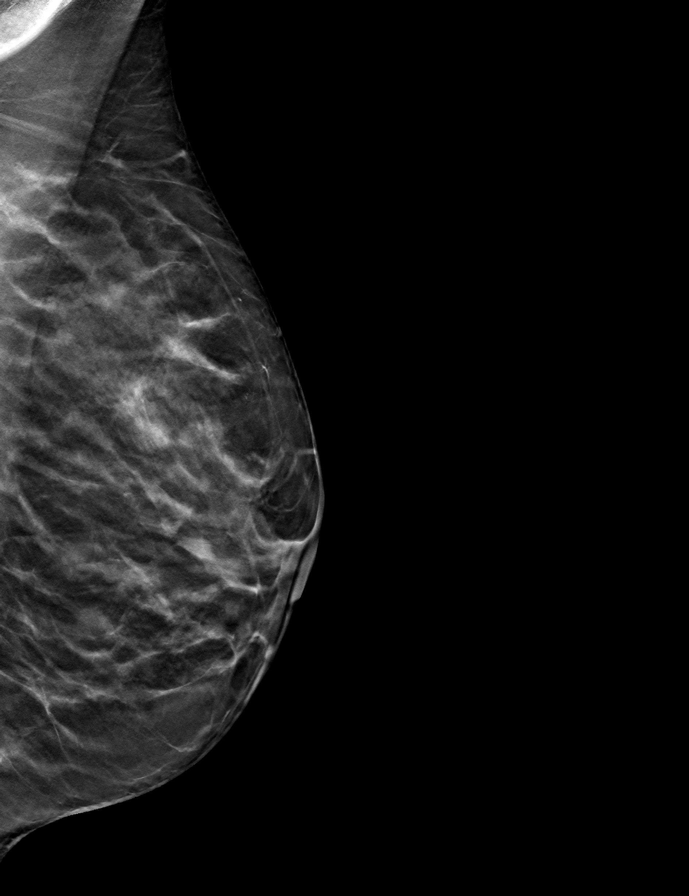
[frame 29/56]
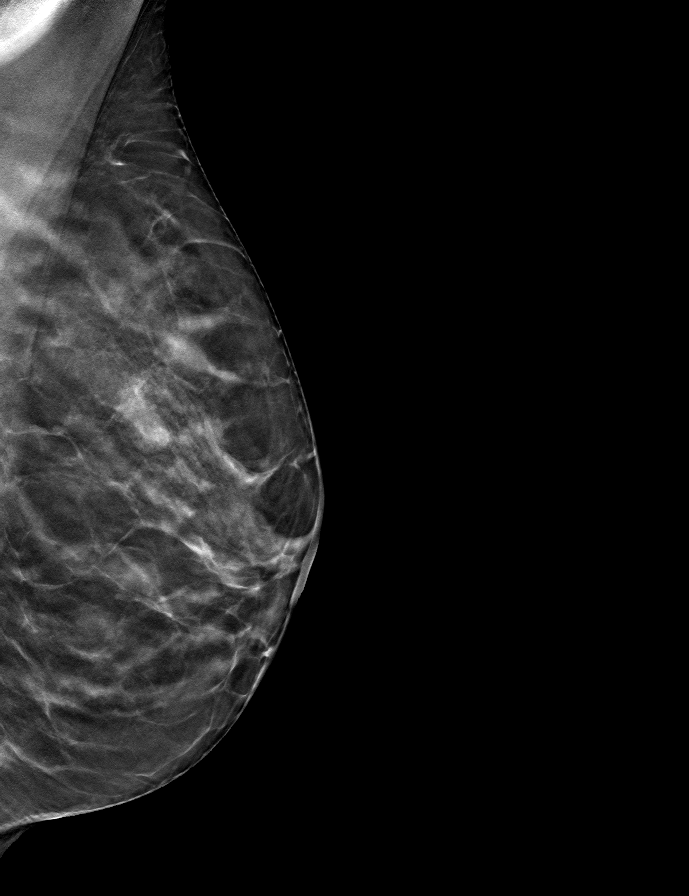

[R CC tomo · tomo slice 31/60.0]
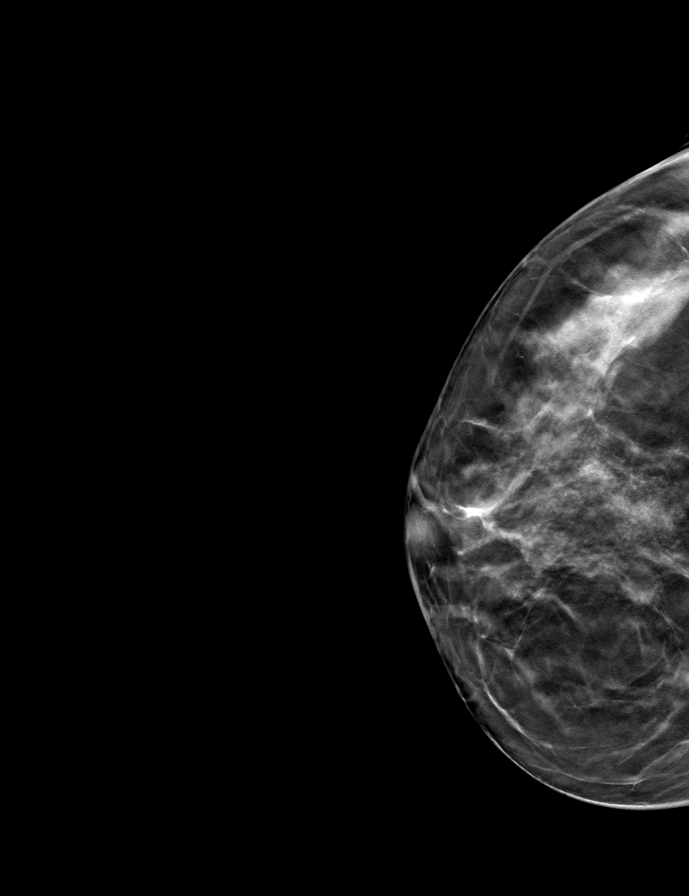

[R MLO tomo · tomo slice 29/56.0]
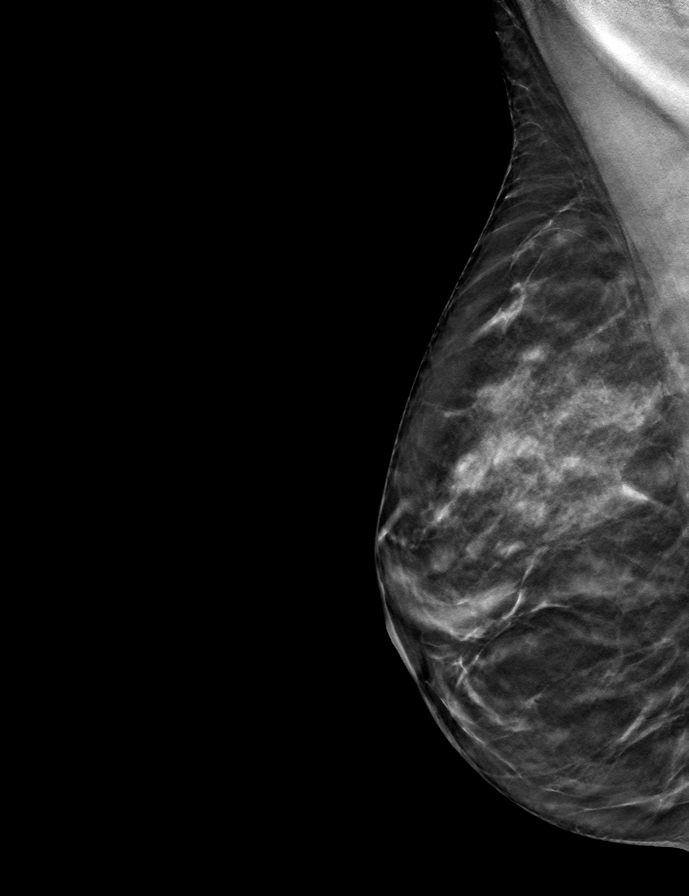

[L CC tomo · tomo slice 29/58.0]
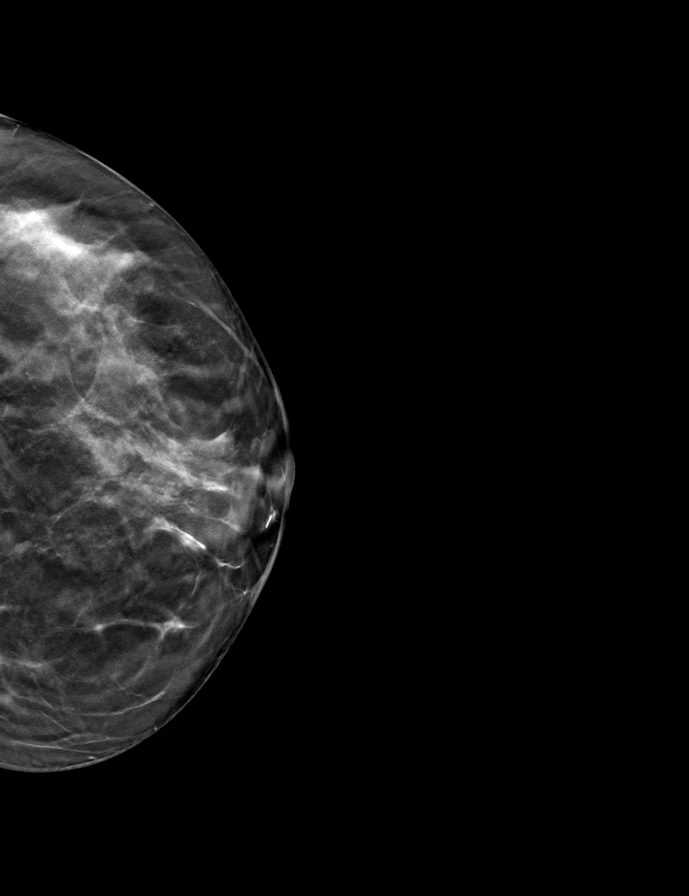

[9 of 24 positions shown; findings below may reference images not displayed]

ACR Breast Density Category c: The breast tissue is heterogeneously
dense, which may obscure small masses.
FINDINGS: There are no findings suspicious for malignancy. Images were
processed with CAD.
IMPRESSION: No mammographic evidence of malignancy. A result letter of this
screening mammogram will be mailed directly to the patient.

RECOMMENDATION:
Screening mammogram in one year. (Code:[id number]-U-LHB)

BI-RADS CATEGORY  1: Negative.

## 2021-01-09 ENCOUNTER — Other Ambulatory Visit: Payer: Self-pay | Admitting: Internal Medicine

## 2021-01-09 DIAGNOSIS — Z1231 Encounter for screening mammogram for malignant neoplasm of breast: Secondary | ICD-10-CM

## 2021-03-04 ENCOUNTER — Ambulatory Visit
Admission: RE | Admit: 2021-03-04 | Discharge: 2021-03-04 | Disposition: A | Discharge status: Home / Self Care | Payer: 59 | Source: Ambulatory Visit | Attending: Internal Medicine | Admitting: Internal Medicine

## 2021-03-04 ENCOUNTER — Other Ambulatory Visit: Payer: Self-pay

## 2021-03-04 DIAGNOSIS — Z1231 Encounter for screening mammogram for malignant neoplasm of breast: Secondary | ICD-10-CM

## 2021-03-10 ENCOUNTER — Other Ambulatory Visit: Payer: Self-pay | Admitting: Internal Medicine

## 2021-03-10 DIAGNOSIS — R928 Other abnormal and inconclusive findings on diagnostic imaging of breast: Secondary | ICD-10-CM

## 2021-03-23 ENCOUNTER — Ambulatory Visit
Admission: RE | Admit: 2021-03-23 | Discharge: 2021-03-23 | Disposition: A | Discharge status: Home / Self Care | Payer: 59 | Source: Ambulatory Visit | Attending: Internal Medicine | Admitting: Internal Medicine

## 2021-03-23 ENCOUNTER — Other Ambulatory Visit: Payer: Self-pay

## 2021-03-23 DIAGNOSIS — R928 Other abnormal and inconclusive findings on diagnostic imaging of breast: Secondary | ICD-10-CM

## 2021-12-28 ENCOUNTER — Encounter: Payer: Self-pay | Admitting: Internal Medicine

## 2022-03-16 ENCOUNTER — Other Ambulatory Visit: Payer: Self-pay | Admitting: Internal Medicine

## 2022-03-16 DIAGNOSIS — Z1231 Encounter for screening mammogram for malignant neoplasm of breast: Secondary | ICD-10-CM

## 2022-04-08 ENCOUNTER — Ambulatory Visit
Admission: RE | Admit: 2022-04-08 | Discharge: 2022-04-08 | Disposition: A | Discharge status: Home / Self Care | Payer: Commercial Managed Care - PPO | Source: Ambulatory Visit | Attending: Internal Medicine | Admitting: Internal Medicine

## 2022-04-08 ENCOUNTER — Ambulatory Visit: Payer: 59

## 2022-04-08 DIAGNOSIS — Z1231 Encounter for screening mammogram for malignant neoplasm of breast: Secondary | ICD-10-CM

## 2022-11-03 ENCOUNTER — Encounter: Payer: Self-pay | Admitting: Internal Medicine

## 2022-12-28 ENCOUNTER — Telehealth: Payer: Self-pay

## 2022-12-28 NOTE — Telephone Encounter (Signed)
Called patient at 1035 & 1041 unable to speak with patient. Left message to call back and reschedule PV with nurse or colonoscopy will be cancelled at 5pm

## 2022-12-29 NOTE — Telephone Encounter (Signed)
PV moved to 12/30/22

## 2022-12-30 ENCOUNTER — Ambulatory Visit (AMBULATORY_SURGERY_CENTER): Payer: Commercial Managed Care - PPO

## 2022-12-30 VITALS — Ht 65.0 in | Wt 107.0 lb

## 2022-12-30 DIAGNOSIS — Z86010 Personal history of colonic polyps: Secondary | ICD-10-CM

## 2022-12-30 MED ORDER — NA SULFATE-K SULFATE-MG SULF 17.5-3.13-1.6 GM/177ML PO SOLN: 1.0000 | Freq: Once | ORAL | 0 refills | Status: AC

## 2022-12-30 NOTE — Progress Notes (Signed)

## 2023-01-17 ENCOUNTER — Encounter: Payer: Self-pay | Admitting: Internal Medicine

## 2023-01-25 ENCOUNTER — Encounter: Payer: Commercial Managed Care - PPO | Admitting: Internal Medicine

## 2023-02-22 ENCOUNTER — Ambulatory Visit (AMBULATORY_SURGERY_CENTER): Payer: Commercial Managed Care - PPO | Admitting: Internal Medicine

## 2023-02-22 ENCOUNTER — Encounter: Payer: Self-pay | Admitting: Internal Medicine

## 2023-02-22 VITALS — BP 100/62 | HR 74 | Temp 98.7°F | Resp 11 | Ht 65.0 in | Wt 107.0 lb

## 2023-02-22 DIAGNOSIS — Z86010 Personal history of colonic polyps: Secondary | ICD-10-CM | POA: Diagnosis not present

## 2023-02-22 DIAGNOSIS — Z09 Encounter for follow-up examination after completed treatment for conditions other than malignant neoplasm: Secondary | ICD-10-CM | POA: Diagnosis not present

## 2023-02-22 MED ORDER — SODIUM CHLORIDE 0.9 % IV SOLN: 500.0000 mL | Freq: Once | INTRAVENOUS | Status: DC

## 2023-02-22 NOTE — Progress Notes (Signed)
Pt's states no medical or surgical changes since previsit or office visit. 

## 2023-02-22 NOTE — Op Note (Signed)
[facility] Patient Name: Natasha Nguyen Procedure Date: 02/22/2023 1:33 PM MRN: [id number] Endoscopist: Beverley Fiedler , MD, [id number] Age: 56 Referring MD:  Date of Birth: Oct 22, 1965 Gender: Female Account #: [id number] Procedure:                Colonoscopy Indications:              High risk colon cancer surveillance: Personal                            history of multiple adenomas, Last colonoscopy: May                            2020 (3 adenoma) Medicines:                Monitored Anesthesia Care Procedure:                Pre-Anesthesia Assessment:                           - Prior to the procedure, a History and Physical                            was performed, and patient medications and                            allergies were reviewed. The patient's tolerance of                            previous anesthesia was also reviewed. The risks                            and benefits of the procedure and the sedation                            options and risks were discussed with the patient.                            All questions were answered, and informed consent                            was obtained. Prior Anticoagulants: The patient has                            taken no anticoagulant or antiplatelet agents. ASA                            Grade Assessment: I - A normal, healthy patient.                            After reviewing the risks and benefits, the patient                            was deemed in satisfactory condition to undergo the  procedure.                           After obtaining informed consent, the colonoscope                            was passed under direct vision. Throughout the                            procedure, the patient's blood pressure, pulse, and                            oxygen saturations were monitored continuously. The                            Olympus Scope SN: [id number] was introduced through                             the anus and advanced to the cecum, identified by                            appendiceal orifice and ileocecal valve. The                            colonoscopy was performed without difficulty. The                            patient tolerated the procedure well. The quality                            of the bowel preparation was excellent. The                            ileocecal valve, appendiceal orifice, and rectum                            were photographed. Scope In: 1:40:22 PM Scope Out: 1:54:17 PM Scope Withdrawal Time: 0 hours 11 minutes 19 seconds  Total Procedure Duration: 0 hours 13 minutes 55 seconds  Findings:                 The digital rectal exam was normal.                           The entire examined colon appeared normal on direct                            and retroflexion views. Complications:            No immediate complications. Estimated Blood Loss:     Estimated blood loss: none. Impression:               - The entire examined colon is normal on direct and                            retroflexion views.                           -  No specimens collected. Recommendation:           - Patient has a contact number available for                            emergencies. The signs and symptoms of potential                            delayed complications were discussed with the                            patient. Return to normal activities tomorrow.                            Written discharge instructions were provided to the                            patient.                           - Resume previous diet.                           - Continue present medications.                           - Repeat colonoscopy in 5 years for surveillance. Beverley Fiedler, MD 02/22/2023 2:14:37 PM This report has been signed electronically.

## 2023-02-22 NOTE — Progress Notes (Signed)
Sedate, gd SR, tolerated procedure well, VSS, report to RN 

## 2023-02-22 NOTE — Patient Instructions (Addendum)
  Resume previous diet. Continue present medications. Repeat colonoscopy in 5 years for surveillance   YOU HAD AN ENDOSCOPIC PROCEDURE TODAY AT [facility]:   Refer to the procedure report that was given to you for any specific questions about what was found during the examination.  If the procedure report does not answer your questions, please call your gastroenterologist to clarify.  If you requested that your care partner not be given the details of your procedure findings, then the procedure report has been included in a sealed envelope for you to review at your convenience later.  YOU SHOULD EXPECT: Some feelings of bloating in the abdomen. Passage of more gas than usual.  Walking can help get rid of the air that was put into your GI tract during the procedure and reduce the bloating. If you had a lower endoscopy (such as a colonoscopy or flexible sigmoidoscopy) you may notice spotting of blood in your stool or on the toilet paper. If you underwent a bowel prep for your procedure, you may not have a normal bowel movement for a few days.  Please Note:  You might notice some irritation and congestion in your nose or some drainage.  This is from the oxygen used during your procedure.  There is no need for concern and it should clear up in a day or so.  SYMPTOMS TO REPORT IMMEDIATELY:  Following lower endoscopy (colonoscopy or flexible sigmoidoscopy):  Excessive amounts of blood in the stool  Significant tenderness or worsening of abdominal pains  Swelling of the abdomen that is new, acute  Fever of 100F or higher  For urgent or emergent issues, a gastroenterologist can be reached at any hour by calling [phone]. Do not use MyChart messaging for urgent concerns.    DIET:  We do recommend a small meal at first, but then you may proceed to your regular diet.  Drink plenty of fluids but you should avoid alcoholic beverages for 24 hours.  ACTIVITY:  You should plan to  take it easy for the rest of today and you should NOT DRIVE or use heavy machinery until tomorrow (because of the sedation medicines used during the test).    FOLLOW UP: Our staff will call the number listed on your records the next business day following your procedure.  We will call around 7:15- 8:00 am to check on you and address any questions or concerns that you may have regarding the information given to you following your procedure. If we do not reach you, we will leave a message.     If any biopsies were taken you will be contacted by phone or by letter within the next 1-3 weeks.  Please call us at [phone] if you have not heard about the biopsies in 3 weeks.    SIGNATURES/CONFIDENTIALITY: You and/or your care partner have signed paperwork which will be entered into your electronic medical record.  These signatures attest to the fact that that the information above on your After Visit Summary has been reviewed and is understood.  Full responsibility of the confidentiality of this discharge information lies with you and/or your care-partner.

## 2023-02-23 ENCOUNTER — Telehealth: Payer: Self-pay

## 2023-02-23 NOTE — Telephone Encounter (Signed)
  Follow up Call-     02/22/2023    1:00 PM  Call back number  Post procedure Call Back phone  # [phone]  Permission to leave phone message Yes   Follow up call, LVM

## 2023-05-23 ENCOUNTER — Other Ambulatory Visit: Payer: Self-pay | Admitting: Internal Medicine

## 2023-05-23 DIAGNOSIS — Z1231 Encounter for screening mammogram for malignant neoplasm of breast: Secondary | ICD-10-CM

## 2023-06-21 ENCOUNTER — Ambulatory Visit
Admission: RE | Admit: 2023-06-21 | Discharge: 2023-06-21 | Disposition: A | Discharge status: Home / Self Care | Payer: Commercial Managed Care - PPO | Source: Ambulatory Visit | Attending: Internal Medicine | Admitting: Internal Medicine

## 2023-06-21 DIAGNOSIS — Z1231 Encounter for screening mammogram for malignant neoplasm of breast: Secondary | ICD-10-CM

## 2023-06-23 ENCOUNTER — Other Ambulatory Visit: Payer: Self-pay | Admitting: Internal Medicine

## 2023-06-23 DIAGNOSIS — R928 Other abnormal and inconclusive findings on diagnostic imaging of breast: Secondary | ICD-10-CM

## 2023-07-11 ENCOUNTER — Ambulatory Visit
Admission: RE | Admit: 2023-07-11 | Discharge: 2023-07-11 | Disposition: A | Discharge status: Home / Self Care | Payer: Commercial Managed Care - PPO | Source: Ambulatory Visit | Attending: Internal Medicine | Admitting: Internal Medicine

## 2023-07-11 DIAGNOSIS — R928 Other abnormal and inconclusive findings on diagnostic imaging of breast: Secondary | ICD-10-CM

## 2023-07-12 ENCOUNTER — Other Ambulatory Visit: Payer: Self-pay | Admitting: Internal Medicine

## 2023-07-12 DIAGNOSIS — R921 Mammographic calcification found on diagnostic imaging of breast: Secondary | ICD-10-CM

## 2023-07-14 ENCOUNTER — Ambulatory Visit
Admission: RE | Admit: 2023-07-14 | Discharge: 2023-07-14 | Disposition: A | Discharge status: Home / Self Care | Payer: Commercial Managed Care - PPO | Source: Ambulatory Visit | Attending: Internal Medicine | Admitting: Internal Medicine

## 2023-07-14 DIAGNOSIS — R921 Mammographic calcification found on diagnostic imaging of breast: Secondary | ICD-10-CM

## 2023-07-14 HISTORY — PX: BREAST BIOPSY: SHX20

## 2023-07-15 LAB — SURGICAL PATHOLOGY

## 2024-06-11 ENCOUNTER — Other Ambulatory Visit: Payer: Self-pay | Admitting: Internal Medicine

## 2024-06-11 DIAGNOSIS — Z1231 Encounter for screening mammogram for malignant neoplasm of breast: Secondary | ICD-10-CM

## 2024-07-16 ENCOUNTER — Ambulatory Visit
Admission: RE | Admit: 2024-07-16 | Discharge: 2024-07-16 | Disposition: A | Source: Ambulatory Visit | Attending: Internal Medicine

## 2024-07-16 DIAGNOSIS — Z1231 Encounter for screening mammogram for malignant neoplasm of breast: Secondary | ICD-10-CM
